# Patient Record
Sex: Male | Born: 1990 | Race: Black or African American | Hispanic: No | Marital: Single | Smoking: Current every day smoker
Health system: Southern US, Community
[De-identification: ages and names within clinical notes are randomized; demographics above are authoritative.]

---

## 2013-07-18 ENCOUNTER — Emergency Department (HOSPITAL_COMMUNITY): Payer: Worker's Compensation

## 2013-07-18 ENCOUNTER — Emergency Department (HOSPITAL_COMMUNITY)
Admission: EM | Admit: 2013-07-18 | Discharge: 2013-07-18 | Disposition: A | Discharge status: Home / Self Care | Payer: Worker's Compensation | Attending: Emergency Medicine | Admitting: Emergency Medicine

## 2013-07-18 ENCOUNTER — Encounter (HOSPITAL_COMMUNITY): Payer: Self-pay | Admitting: Emergency Medicine

## 2013-07-18 DIAGNOSIS — T07XXXA Unspecified multiple injuries, initial encounter: Secondary | ICD-10-CM

## 2013-07-18 DIAGNOSIS — S41112A Laceration without foreign body of left upper arm, initial encounter: Secondary | ICD-10-CM

## 2013-07-18 DIAGNOSIS — S51009A Unspecified open wound of unspecified elbow, initial encounter: Secondary | ICD-10-CM | POA: Insufficient documentation

## 2013-07-18 DIAGNOSIS — Y92410 Unspecified street and highway as the place of occurrence of the external cause: Secondary | ICD-10-CM | POA: Insufficient documentation

## 2013-07-18 DIAGNOSIS — Z23 Encounter for immunization: Secondary | ICD-10-CM | POA: Insufficient documentation

## 2013-07-18 DIAGNOSIS — S41109A Unspecified open wound of unspecified upper arm, initial encounter: Secondary | ICD-10-CM | POA: Insufficient documentation

## 2013-07-18 DIAGNOSIS — Y9389 Activity, other specified: Secondary | ICD-10-CM | POA: Insufficient documentation

## 2013-07-18 DIAGNOSIS — R209 Unspecified disturbances of skin sensation: Secondary | ICD-10-CM | POA: Insufficient documentation

## 2013-07-18 DIAGNOSIS — IMO0002 Reserved for concepts with insufficient information to code with codable children: Secondary | ICD-10-CM | POA: Insufficient documentation

## 2013-07-18 DIAGNOSIS — F17200 Nicotine dependence, unspecified, uncomplicated: Secondary | ICD-10-CM | POA: Insufficient documentation

## 2013-07-18 MED ORDER — TETANUS-DIPHTH-ACELL PERTUSSIS 5-2.5-18.5 LF-MCG/0.5 IM SUSP
0.5000 mL | Freq: Once | INTRAMUSCULAR | Status: AC
  Administered 2013-07-18: 0.5 mL via INTRAMUSCULAR
  Filled 2013-07-18: qty 0.5

## 2013-07-18 MED ORDER — HYDROMORPHONE HCL PF 1 MG/ML IJ SOLN
1.0000 mg | Freq: Once | INTRAMUSCULAR | Status: AC
  Administered 2013-07-18: 1 mg via INTRAVENOUS
  Filled 2013-07-18: qty 1

## 2013-07-18 MED ORDER — CEFAZOLIN SODIUM 1-5 GM-% IV SOLN
1.0000 g | Freq: Once | INTRAVENOUS | Status: AC
  Administered 2013-07-18: 1 g via INTRAVENOUS
  Filled 2013-07-18: qty 50

## 2013-07-18 MED ORDER — HYDROCODONE-ACETAMINOPHEN 5-325 MG PO TABS: 1.0000 | ORAL_TABLET | Freq: Four times a day (QID) | ORAL | Status: AC | PRN

## 2013-07-18 MED ORDER — LIDOCAINE-EPINEPHRINE 2 %-1:100000 IJ SOLN
20.0000 mL | Freq: Once | INTRAMUSCULAR | Status: DC
  Filled 2013-07-18: qty 20

## 2013-07-18 MED ORDER — ONDANSETRON HCL 4 MG/2ML IJ SOLN
4.0000 mg | Freq: Once | INTRAMUSCULAR | Status: AC
  Administered 2013-07-18: 4 mg via INTRAVENOUS
  Filled 2013-07-18: qty 2

## 2013-07-18 MED ORDER — CLINDAMYCIN HCL 150 MG PO CAPS: 300.0000 mg | ORAL_CAPSULE | Freq: Three times a day (TID) | ORAL | Status: AC
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
## 2013-07-18 NOTE — Discharge Instructions (Signed)
Twice daily clean with soap and water. Keep in a sling when up. When sitting down keep arm elevated. Take antibiotics as directed. Call to make an appointment with Dr. Janee Mornhompson for 10 days for suture removal.   Laceration Care, Adult A laceration is a cut or lesion that goes through all layers of the skin and into the tissue just beneath the skin. TREATMENT  Some lacerations may not require closure. Some lacerations may not be able to be closed due to an increased risk of infection. It is important to see your caregiver as soon as possible after an injury to minimize the risk of infection and maximize the opportunity for successful closure. If closure is appropriate, pain medicines may be given, if needed. The wound will be cleaned to help prevent infection. Your caregiver will use stitches (sutures), staples, wound glue (adhesive), or skin adhesive strips to repair the laceration. These tools bring the skin edges together to allow for faster healing and a better cosmetic outcome. However, all wounds will heal with a scar. Once the wound has healed, scarring can be minimized by covering the wound with sunscreen during the day for 1 full year. HOME CARE INSTRUCTIONS  For sutures or staples:  Keep the wound clean and dry.  If you were given a bandage (dressing), you should change it at least once a day. Also, change the dressing if it becomes wet or dirty, or as directed by your caregiver.  Wash the wound with soap and water 2 times a day. Rinse the wound off with water to remove all soap. Pat the wound dry with a clean towel.  After cleaning, apply a thin layer of the antibiotic ointment as recommended by your caregiver. This will help prevent infection and keep the dressing from sticking.  You may shower as usual after the first 24 hours. Do not soak the wound in water until the sutures are removed.  Only take over-the-counter or prescription medicines for pain, discomfort, or fever as directed  by your caregiver.  Get your sutures or staples removed as directed by your caregiver. For skin adhesive strips:  Keep the wound clean and dry.  Do not get the skin adhesive strips wet. You may bathe carefully, using caution to keep the wound dry.  If the wound gets wet, pat it dry with a clean towel.  Skin adhesive strips will fall off on their own. You may trim the strips as the wound heals. Do not remove skin adhesive strips that are still stuck to the wound. They will fall off in time. For wound adhesive:  You may briefly wet your wound in the shower or bath. Do not soak or scrub the wound. Do not swim. Avoid periods of heavy perspiration until the skin adhesive has fallen off on its own. After showering or bathing, gently pat the wound dry with a clean towel.  Do not apply liquid medicine, cream medicine, or ointment medicine to your wound while the skin adhesive is in place. This may loosen the film before your wound is healed.  If a dressing is placed over the wound, be careful not to apply tape directly over the skin adhesive. This may cause the adhesive to be pulled off before the wound is healed.  Avoid prolonged exposure to sunlight or tanning lamps while the skin adhesive is in place. Exposure to ultraviolet light in the first year will darken the scar.  The skin adhesive will usually remain in place for 5 to 10  days, then naturally fall off the skin. Do not pick at the adhesive film. You may need a tetanus shot if:  You cannot remember when you had your last tetanus shot.  You have never had a tetanus shot. If you get a tetanus shot, your arm may swell, get red, and feel warm to the touch. This is common and not a problem. If you need a tetanus shot and you choose not to have one, there is a rare chance of getting tetanus. Sickness from tetanus can be serious. SEEK MEDICAL CARE IF:   You have redness, swelling, or increasing pain in the wound.  You see a red line that  goes away from the wound.  You have yellowish-white fluid (pus) coming from the wound.  You have a fever.  You notice a bad smell coming from the wound or dressing.  Your wound breaks open before or after sutures have been removed.  You notice something coming out of the wound such as wood or glass.  Your wound is on your hand or foot and you cannot move a finger or toe. SEEK IMMEDIATE MEDICAL CARE IF:   Your pain is not controlled with prescribed medicine.  You have severe swelling around the wound causing pain and numbness or a change in color in your arm, hand, leg, or foot.  Your wound splits open and starts bleeding.  You have worsening numbness, weakness, or loss of function of any joint around or beyond the wound.  You develop painful lumps near the wound or on the skin anywhere on your body. MAKE SURE YOU:   Understand these instructions.  Will watch your condition.  Will get help right away if you are not doing well or get worse. Document Released: 06/11/2005 Document Revised: 09/03/2011 Document Reviewed: 12/05/2010 [facility] Patient Information 2014 [place], [place].

## 2013-07-18 NOTE — ED Notes (Signed)
Patient transported to X-ray 
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
